# Patient Record
Sex: Male | Born: 1980 | Race: White | Hispanic: No | Smoking: Never smoker
Health system: Southern US, Community
[De-identification: ages and names within clinical notes are randomized; demographics above are authoritative.]

## PROBLEM LIST (undated history)

## (undated) DIAGNOSIS — G919 Hydrocephalus, unspecified: Secondary | ICD-10-CM

## (undated) DIAGNOSIS — R569 Unspecified convulsions: Secondary | ICD-10-CM

## (undated) HISTORY — PX: GANGLION CYST EXCISION: SHX1691

## (undated) HISTORY — PX: BRAIN SURGERY: SHX531

## (undated) HISTORY — PX: HERNIA REPAIR: SHX51

## (undated) HISTORY — PX: SHOULDER ARTHROSCOPY: SHX128

---

## 2016-09-18 DIAGNOSIS — S43005A Unspecified dislocation of left shoulder joint, initial encounter: Secondary | ICD-10-CM | POA: Insufficient documentation

## 2016-09-18 DIAGNOSIS — Z885 Allergy status to narcotic agent status: Secondary | ICD-10-CM | POA: Insufficient documentation

## 2016-09-18 DIAGNOSIS — S66812A Strain of other specified muscles, fascia and tendons at wrist and hand level, left hand, initial encounter: Secondary | ICD-10-CM | POA: Insufficient documentation

## 2016-09-18 DIAGNOSIS — Y999 Unspecified external cause status: Secondary | ICD-10-CM | POA: Insufficient documentation

## 2016-09-18 DIAGNOSIS — W11XXXA Fall on and from ladder, initial encounter: Secondary | ICD-10-CM | POA: Insufficient documentation

## 2016-09-18 DIAGNOSIS — Y939 Activity, unspecified: Secondary | ICD-10-CM | POA: Insufficient documentation

## 2016-09-18 DIAGNOSIS — Y929 Unspecified place or not applicable: Secondary | ICD-10-CM | POA: Insufficient documentation

## 2016-09-18 NOTE — ED Triage Notes (Signed)
Pt presents post fall from ladder 10-11 ft, pt states he fell onto L side @ 2300 tonight.  ? Shoulder dislocation and L index finger tendon rupture Pt states he facetimed his surgeon and was able to possibly reduce shoulder, index finger is in down position, patient denies LOC.

## 2016-09-19 ENCOUNTER — Emergency Department (HOSPITAL_COMMUNITY): Payer: Self-pay

## 2016-09-19 ENCOUNTER — Encounter (HOSPITAL_COMMUNITY): Payer: Self-pay | Admitting: Emergency Medicine

## 2016-09-19 ENCOUNTER — Emergency Department (HOSPITAL_COMMUNITY)
Admission: EM | Admit: 2016-09-19 | Discharge: 2016-09-19 | Disposition: A | Discharge status: Home / Self Care | Payer: Self-pay | Attending: Emergency Medicine | Admitting: Emergency Medicine

## 2016-09-19 DIAGNOSIS — S43005A Unspecified dislocation of left shoulder joint, initial encounter: Secondary | ICD-10-CM

## 2016-09-19 DIAGNOSIS — S66812A Strain of other specified muscles, fascia and tendons at wrist and hand level, left hand, initial encounter: Secondary | ICD-10-CM

## 2016-09-19 HISTORY — DX: Hydrocephalus, unspecified: G91.9

## 2016-09-19 HISTORY — DX: Unspecified convulsions: R56.9

## 2016-09-19 MED ORDER — MORPHINE SULFATE 15 MG PO TABS
15.0000 mg | ORAL_TABLET | Freq: Once | ORAL | Status: AC
  Administered 2016-09-19: 15 mg via ORAL
  Filled 2016-09-19: qty 1

## 2016-09-19 MED ORDER — MORPHINE SULFATE 15 MG PO TABS: 15.0000 mg | ORAL_TABLET | ORAL | 0 refills | Status: AC | PRN

## 2016-09-19 NOTE — Discharge Instructions (Signed)
Take 4 over the counter ibuprofen tablets 3 times a day or 2 over-the-counter naproxen tablets twice a day for pain. Also take tylenol 1000mg (2 extra strength) four times a day.   Then take the pain medicine if you feel like you need it. Narcotics do not help with the pain, they only make you care about it less.  You can become addicted to this, people may break into your house to steal it.  It will constipate you.  If you drive under the influence of this medicine you can get a DUI.    You need to call to make the appointments with ortho for follow up.

## 2016-09-19 NOTE — ED Provider Notes (Signed)
[facility]-EMERGENCY DEPT Provider Note   CSN: [id number] Arrival date & time: 09/18/16  2345   By signing my name below, I, Dominic Lambert, attest that this documentation has been prepared under the direction and in the presence of Dominic Plan, DO . Electronically Signed: Freida Lambert, Scribe. 09/19/2016. 1:33 AM.   History   Chief Complaint Chief Complaint  Patient presents with  . Fall    The history is provided by the patient. No language interpreter was used.  Fall  This is a new problem. The current episode started 3 to 5 hours ago. The problem occurs constantly. The problem has been gradually improving. Pertinent negatives include no chest pain, no abdominal pain, no headaches and no shortness of breath. Relieved by: home reduction  The treatment provided moderate relief.     HPI Comments:  Dominic Lambert is a 36 y.o. male who presents to the Emergency Department s/p fall from a ladder complaining of left shoulder pain following the incident. Pt fell from a ladder ~10 feet and landed on his left side. He reports associated pain to the left index finger. He denies LOC and neck pan.  Pt called his friend, who is a Careers adviser, who talked him through reduction of the left shoulder. He notes mobility of the LUE has improved since home reduction but he still has pain to the site.   Past Medical History:  Diagnosis Date  . Hydrocephalus   . Seizures (HCC)     There are no active problems to display for this patient.   Past Surgical History:  Procedure Laterality Date  . BRAIN SURGERY    . GANGLION CYST EXCISION    . HERNIA REPAIR    . SHOULDER ARTHROSCOPY         Home Medications    Prior to Admission medications   Medication Sig Start Date End Date Taking? Authorizing Provider  morphine (MSIR) 15 MG tablet Take 1 tablet (15 mg total) by mouth every 4 (four) hours as needed for severe pain. 09/19/16   Dominic Plan, DO    Family History No family history on file.  Social  History Social History  Substance Use Topics  . Smoking status: Never Smoker  . Smokeless tobacco: Never Used  . Alcohol use Yes     Comment: occ     Allergies   Tramadol   Review of Systems Review of Systems  Constitutional: Negative for chills and fever.  HENT: Negative for congestion and facial swelling.   Eyes: Negative for discharge and visual disturbance.  Respiratory: Negative for shortness of breath.   Cardiovascular: Negative for chest pain and palpitations.  Gastrointestinal: Negative for abdominal pain, diarrhea and vomiting.  Musculoskeletal: Positive for arthralgias and myalgias. Negative for neck pain.  Skin: Negative for color change and rash.  Neurological: Negative for tremors, syncope and headaches.  Psychiatric/Behavioral: Negative for confusion and dysphoric mood.     Physical Exam Updated Vital Signs BP (!) 155/80   Pulse 78   Temp 99.1 F (37.3 C) (Oral)   Resp 18   Ht 5\' 10"  (1.778 m)   Wt 74.8 kg (165 lb)   SpO2 98%   BMI 23.68 kg/m   Physical Exam  Constitutional: He is oriented to person, place, and time. He appears well-developed and well-nourished.  HENT:  Head: Normocephalic and atraumatic.  Eyes: EOM are normal. Pupils are equal, round, and reactive to light.  Neck: Normal range of motion. Neck supple. No JVD present.  Cardiovascular: Normal  rate and regular rhythm.  Exam reveals no gallop and no friction rub.   No murmur heard. Pulmonary/Chest: No respiratory distress. He has no wheezes.  Abdominal: He exhibits no distension. There is no rebound and no guarding.  Musculoskeletal: Normal range of motion.  Focal to left anterior shoulder  No pain along clavicle; no pain with ROM; FROM of elbow Pulse and sensation intact to left hand  Unable to extend 2nd digit of left hand at MCP and PIP; Able to extend at DIP fully Flexion intact  Intact sensation to light touch to either lateral aspect of the digit  Neurological: He is alert  and oriented to person, place, and time.  Skin: No rash noted. No pallor.  Psychiatric: He has a normal mood and affect. His behavior is normal.  Nursing note and vitals reviewed.    ED Treatments / Results  DIAGNOSTIC STUDIES:  Oxygen Saturation is 97% on RA, normal by my interpretation.    COORDINATION OF CARE:  1:33 AM Discussed treatment Lambert with pt at bedside and pt agreed to Lambert.  Labs (all labs ordered are listed, but only abnormal results are displayed) Labs Reviewed - No data to display  EKG  EKG Interpretation None       Radiology Dg Shoulder Left  Result Date: 09/19/2016 CLINICAL DATA:  Fell 10 feet from ladder. Shoulder dislocation. History of shoulder arthroscopy. EXAM: LEFT SHOULDER - 2+ VIEW COMPARISON:  None. FINDINGS: The humeral head is well-formed and located. The subacromial, glenohumeral and acromioclavicular joint spaces are intact. No destructive bony lesions. Soft tissue planes are non-suspicious. IMPRESSION: Negative. Electronically Signed   By: Awilda Metroourtnay  Bloomer M.D.   On: 09/19/2016 01:17   Dg Finger Index Left  Result Date: 09/19/2016 CLINICAL DATA:  Larey SeatFell 10 feet from a ladder.  Finger tendon rupture. EXAM: LEFT INDEX FINGER 2+V COMPARISON:  None. FINDINGS: There is no evidence of fracture or dislocation. There is no evidence of arthropathy or other focal bone abnormality. Soft tissues are unremarkable. IMPRESSION: Negative. Electronically Signed   By: Awilda Metroourtnay  Bloomer M.D.   On: 09/19/2016 01:16    Procedures Procedures (including critical care time)  Medications Ordered in ED Medications  morphine (MSIR) tablet 15 mg (not administered)     Initial Impression / Assessment and Lambert / ED Course  I have reviewed the triage vital signs and the nursing notes.  Pertinent labs & imaging results that were available during my care of the patient were reviewed by me and considered in my medical decision making (see chart for details).     36  yo M With a chief complaint of left shoulder and left first finger pain. The patient fell 10 feet off of a ladder. Landed on an outstretched left arm. His shoulder was initially dislocated. He is able to relocate this at home via face time with a friend of his who is an Investment banker, operationalorthopedic surgeon. X-ray here with relocation of the shoulder. Placed in a sling. Patient was an extensor tendon injury clinically. We'll place in a static splint. Follow-up with hand surgery.  1:44 AM:  I have discussed the diagnosis/risks/treatment options with the patient and believe the pt to be eligible for discharge home to follow-up with Ortho, Hand. We also discussed returning to the ED immediately if new or worsening sx occur. We discussed the sx which are most concerning (e.g., sudden worsening pain) that necessitate immediate return. Medications administered to the patient during their visit and any new prescriptions provided to  the patient are listed below.  Medications given during this visit Medications  morphine (MSIR) tablet 15 mg (not administered)     The patient appears reasonably screen and/or stabilized for discharge and I doubt any other medical condition or other [facility] requiring further screening, evaluation, or treatment in the ED at this time prior to discharge.    Final Clinical Impressions(s) / ED Diagnoses   Final diagnoses:  Shoulder dislocation, left, initial encounter  Extensor tendon rupture of hand, left, initial encounter    New Prescriptions New Prescriptions   MORPHINE (MSIR) 15 MG TABLET    Take 1 tablet (15 mg total) by mouth every 4 (four) hours as needed for severe pain.   I personally performed the services described in this documentation, which was scribed in my presence. The recorded information has been reviewed and is accurate.      Dominic Plan, DO 09/19/16 [phone]

## 2016-09-19 NOTE — Progress Notes (Signed)
Orthopedic Tech Progress Note Patient Details:  Wendelyn BreslowJason Donley May 08, 1980 [id number]  Ortho Devices Type of Ortho Device: Finger splint, Arm sling Ortho Device/Splint Location: lue index finger splint Ortho Device/Splint Interventions: Ordered, Application, Adjustment   Trinna PostMartinez, Kalany Diekmann J 09/19/2016, 2:01 AM

## 2016-09-19 NOTE — ED Notes (Signed)
Ortho tech paged  

## 2016-09-19 NOTE — ED Notes (Signed)
Paged family medicine 

## 2019-02-01 IMAGING — DX DG FINGER INDEX 2+V*L*
3 series · 3 of 3 positions shown · non-contrast
Comparison: None.

CLINICAL DATA: Fell 10 feet from a ladder.  Finger tendon rupture.

EXAM:
LEFT INDEX FINGER 2+V

[finger ap]
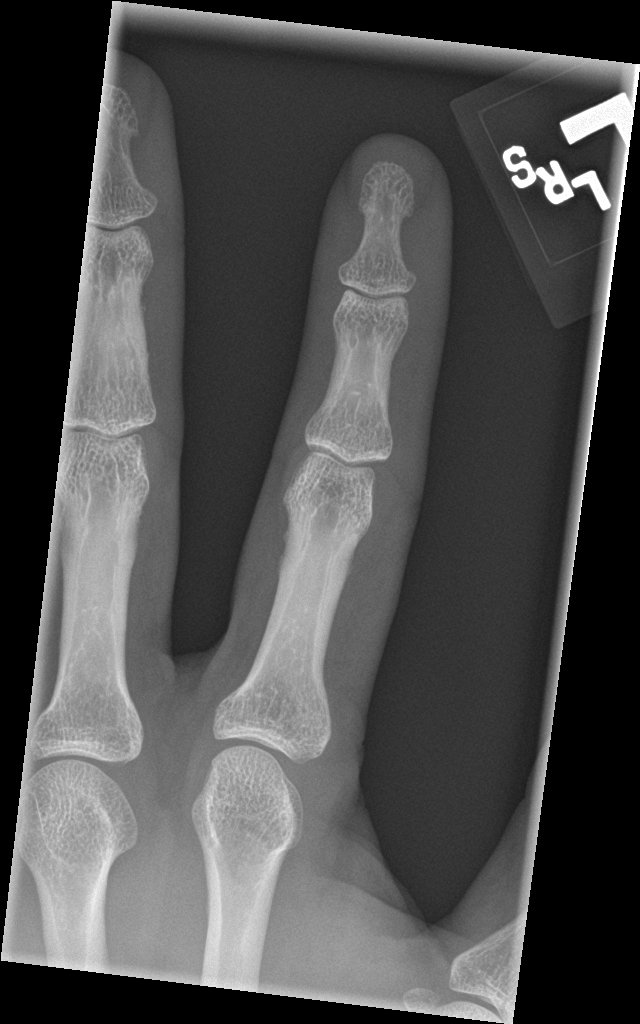

[finger obl]
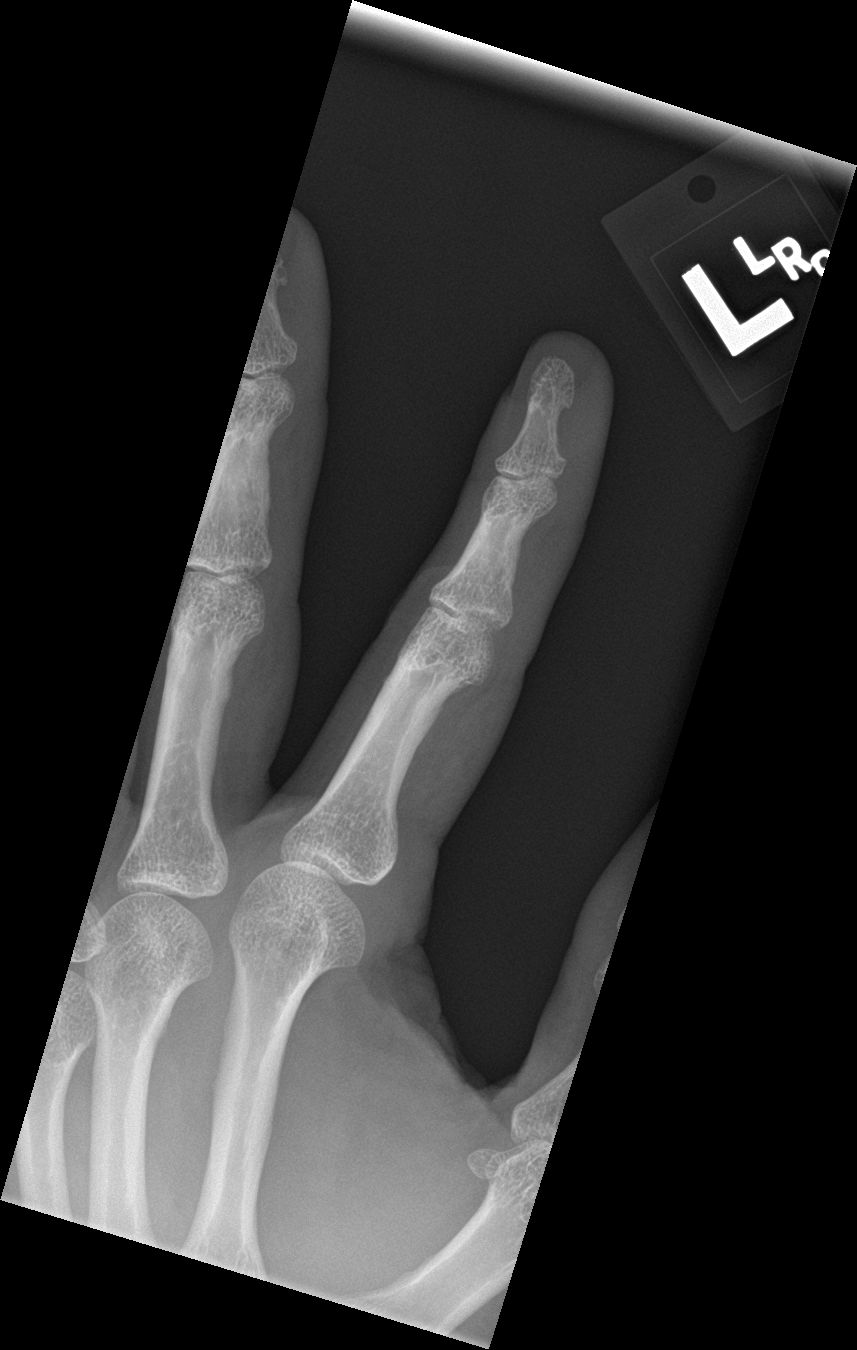

[finger lat]
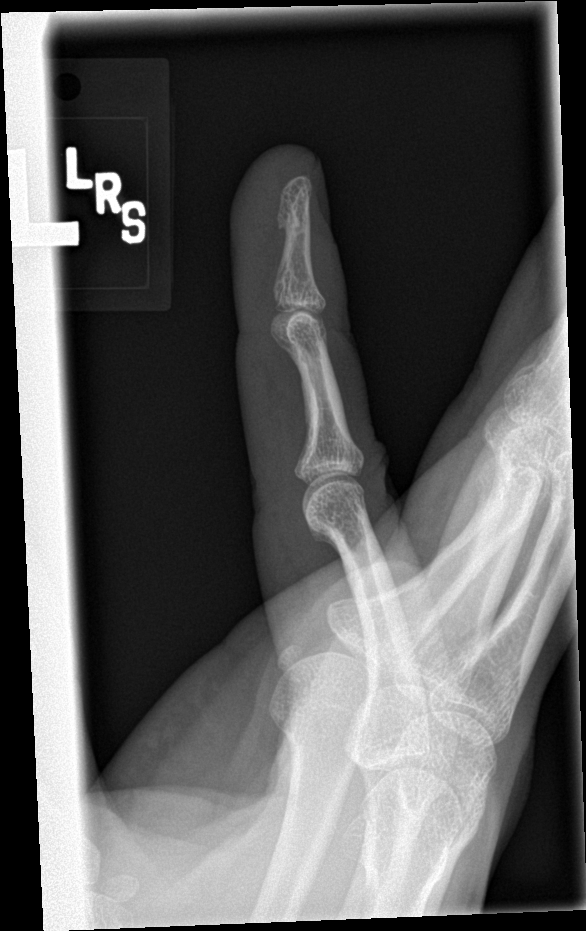

[3 of 3 positions shown; findings below may reference images not displayed]

FINDINGS: There is no evidence of fracture or dislocation. There is no
evidence of arthropathy or other focal bone abnormality. Soft
tissues are unremarkable.
IMPRESSION: Negative.
